# Patient Record
Sex: Female | Born: 1995 | Race: White | Hispanic: No | Marital: Single | Smoking: Never smoker
Health system: Southern US, Community
[De-identification: ages and names within clinical notes are randomized; demographics above are authoritative.]

## PROBLEM LIST (undated history)

## (undated) DIAGNOSIS — T7840XA Allergy, unspecified, initial encounter: Secondary | ICD-10-CM

## (undated) HISTORY — DX: Allergy, unspecified, initial encounter: T78.40XA

## (undated) HISTORY — PX: MOLE REMOVAL: SHX2046

---
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
## 2014-03-18 ENCOUNTER — Telehealth: Payer: Self-pay | Admitting: Family Medicine

## 2014-03-18 DIAGNOSIS — D509 Iron deficiency anemia, unspecified: Secondary | ICD-10-CM

## 2014-03-18 NOTE — Telephone Encounter (Signed)
Does she just want the ferritin (iron) level?  Or iron level (ferritin) AND the blood count we usually do with it?

## 2014-03-18 NOTE — Telephone Encounter (Signed)
Spoke with mom and she said what ever labs were done last time.

## 2014-03-18 NOTE — Telephone Encounter (Signed)
Ok, both ferritin and CBC with diff orders placed.  Thanks!

## 2014-03-22 ENCOUNTER — Other Ambulatory Visit: Payer: Self-pay | Admitting: Family Medicine

## 2014-03-22 LAB — CBC WITH DIFFERENTIAL/PLATELET
Basophils Absolute: 0 10*3/uL (ref 0.0–0.1)
Basophils Relative: 0 % (ref 0–1)
EOS ABS: 0 10*3/uL (ref 0.0–0.7)
EOS PCT: 1 % (ref 0–5)
HEMATOCRIT: 39 % (ref 36.0–46.0)
HEMOGLOBIN: 13.1 g/dL (ref 12.0–15.0)
LYMPHS ABS: 1.4 10*3/uL (ref 0.7–4.0)
Lymphocytes Relative: 29 % (ref 12–46)
MCH: 30.3 pg (ref 26.0–34.0)
MCHC: 33.6 g/dL (ref 30.0–36.0)
MCV: 90.1 fL (ref 78.0–100.0)
MONOS PCT: 6 % (ref 3–12)
MPV: 10.6 fL (ref 9.4–12.4)
Monocytes Absolute: 0.3 10*3/uL (ref 0.1–1.0)
Neutro Abs: 3.1 10*3/uL (ref 1.7–7.7)
Neutrophils Relative %: 64 % (ref 43–77)
Platelets: 218 10*3/uL (ref 150–400)
RBC: 4.33 MIL/uL (ref 3.87–5.11)
RDW: 13.3 % (ref 11.5–15.5)
WBC: 4.9 10*3/uL (ref 4.0–10.5)

## 2014-03-23 LAB — FERRITIN: Ferritin: 40 ng/mL (ref 10–291)

## 2014-06-09 IMAGING — CR DG CHEST 2V
2 series · 2 of 2 positions shown · non-contrast
Comparison: None

CLINICAL DATA: Dyspnea

CHEST - 2 VIEW

[view not recorded (1 of 2)]
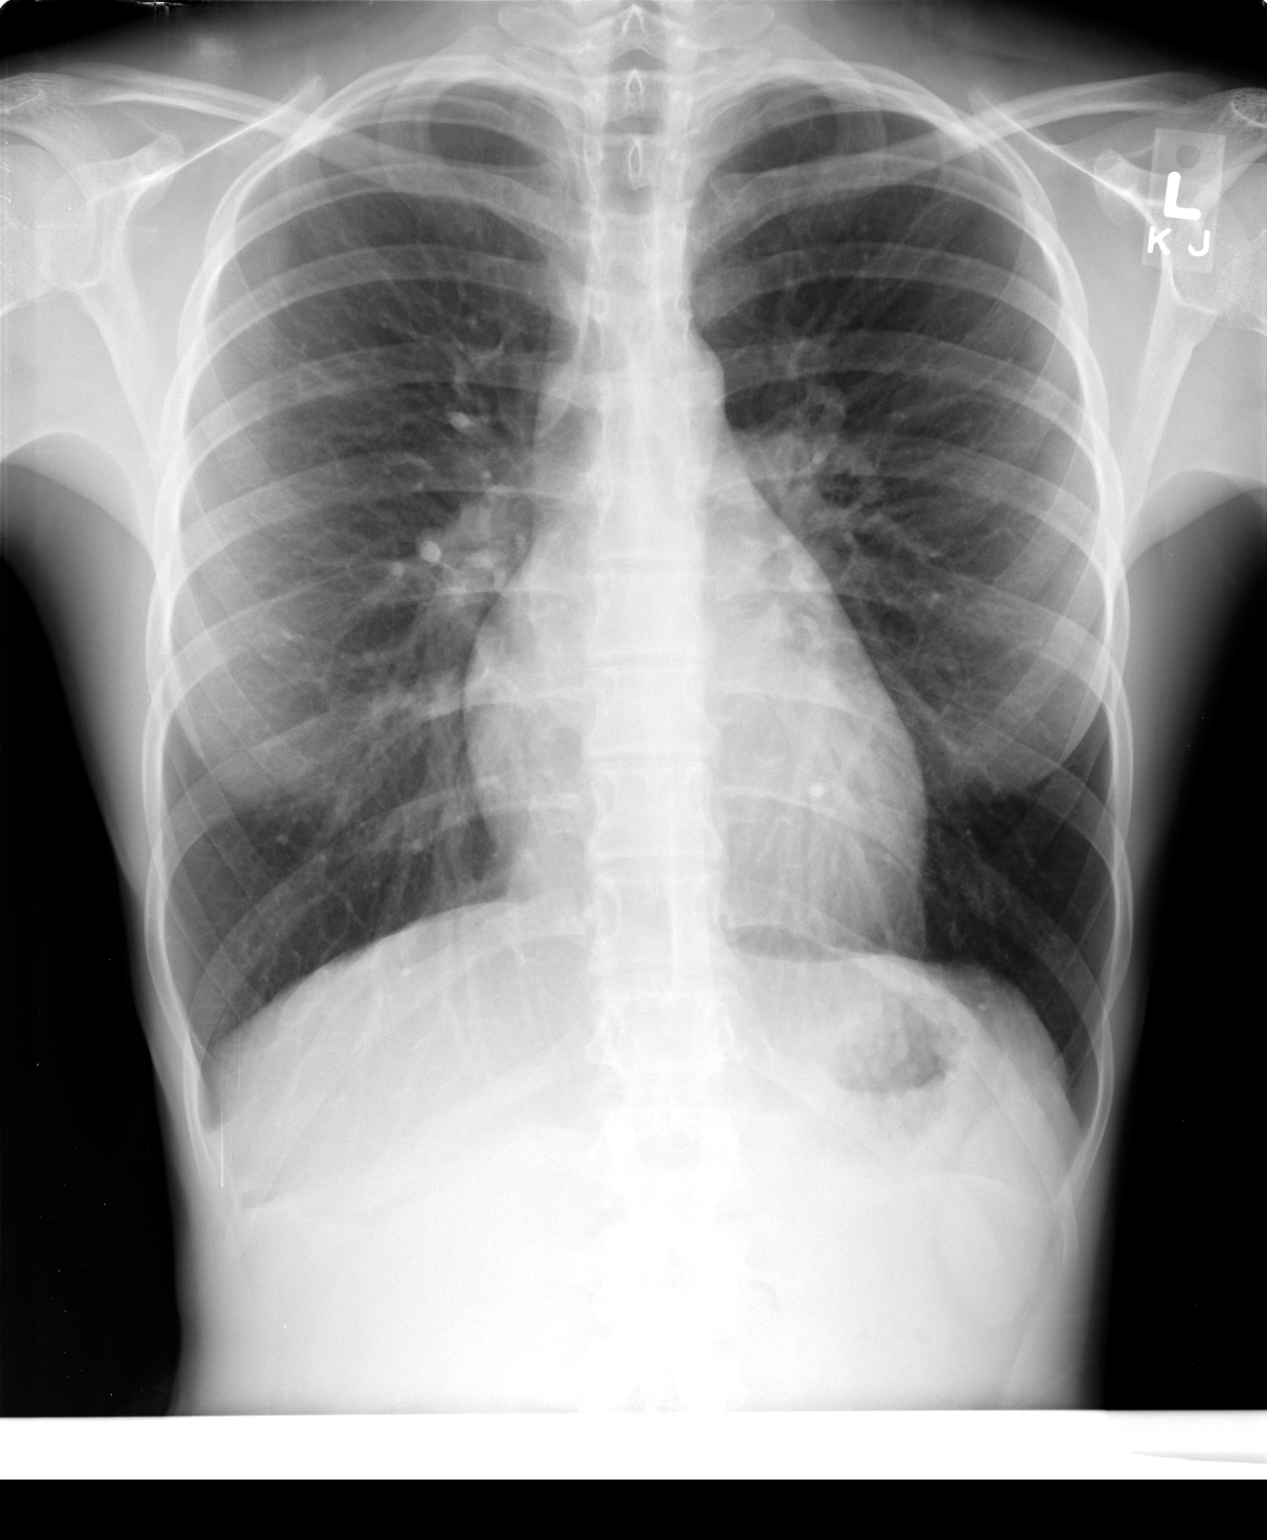

[view not recorded (2 of 2)]
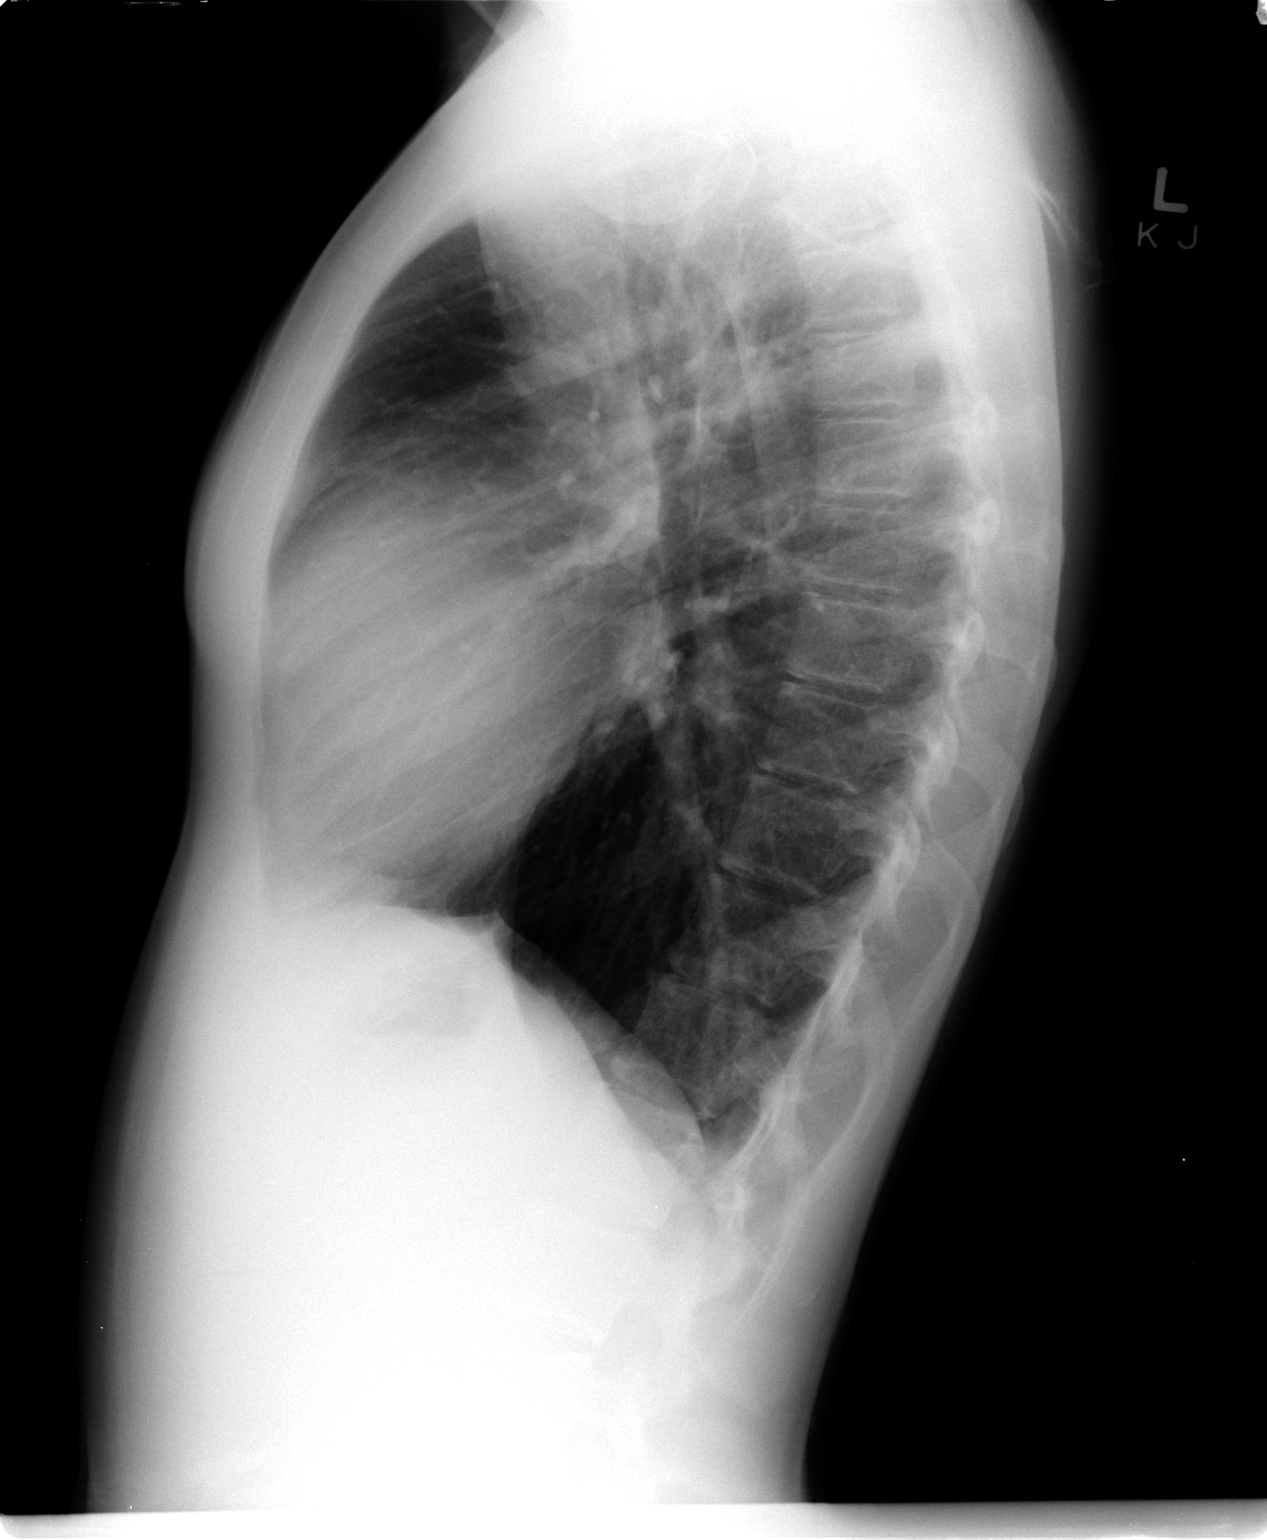

[2 of 2 positions shown; findings below may reference images not displayed]

FINDINGS: The heart size and mediastinal contours are within normal
limits.  The lungs are hyperinflated but clear.   The visualized
skeletal structures are unremarkable.
IMPRESSION: Negative exam.

## 2014-11-11 ENCOUNTER — Ambulatory Visit (INDEPENDENT_AMBULATORY_CARE_PROVIDER_SITE_OTHER): Payer: BLUE CROSS/BLUE SHIELD | Admitting: Family Medicine

## 2014-11-11 ENCOUNTER — Encounter: Payer: Self-pay | Admitting: Family Medicine

## 2014-11-11 VITALS — BP 99/62 | HR 57 | Ht 65.0 in | Wt 118.0 lb

## 2014-11-11 DIAGNOSIS — M25551 Pain in right hip: Secondary | ICD-10-CM | POA: Diagnosis not present

## 2014-11-11 NOTE — Patient Instructions (Signed)
You strained your hip flexor. Do home exercises (standing hip rotations, straight leg raises) 3 sets of 10 once a day - I'd recommend waiting until this weekend to start these given your pain level right now. Start stretches now - hold both hip flexor stretches for 20-30 seconds, repeat 3 times twice a day. Ice (or heat, whichever feels better) 15 minutes at a time 3-4 times a day. Cross train every other day with cycling. Cut running down to about 50% your usual mileage, try to avoid hills/stairs, speed work for now. Increase by 10% per week as long as pain is staying at 1-2/10 level. Follow up with me in 1 month or as needed.

## 2014-11-13 DIAGNOSIS — M25551 Pain in right hip: Secondary | ICD-10-CM | POA: Insufficient documentation

## 2014-11-13 NOTE — Assessment & Plan Note (Signed)
consistent with hip flexor strain.  Shown home exercises and stretches to do regularly after a short period of rest.  Icing, discussed relative rest and how to increase running/activity level.  F/u in 1 month.

## 2014-11-13 NOTE — Progress Notes (Signed)
PCP: Allison QuarryWISELTON,LOUISE A, MD  Subjective:   HPI: Patient is a 19 y.o. female here for right hip pain.  Patient reports she started to get pain on Tuesday the day after she ran a 5k Race had a long downhill she sprinted - thinks this may have led to her current pain in anterior hip/groin. No swelling or bruising. No radiation. Feels this more with stretching. Lifting leg is worse in the morning. Had been up to 35-40 miles a week. Yesterday able to run 11 miles - eased up as run went on. Worse with hills.  Past Medical History  Diagnosis Date  . Asthma   . Allergy     No current outpatient prescriptions on file prior to visit.   No current facility-administered medications on file prior to visit.    Past Surgical History  Procedure Laterality Date  . Mole removal      ON BACK    No Known Allergies  History   Social History  . Marital Status: Single    Spouse Name: N/A  . Number of Children: N/A  . Years of Education: N/A   Occupational History  . student    Social History Main Topics  . Smoking status: Never Smoker   . Smokeless tobacco: Not on file  . Alcohol Use: No  . Drug Use: No  . Sexual Activity: Not on file   Other Topics Concern  . Not on file   Social History Narrative    Family History  Problem Relation Age of Onset  . Diabetes Neg Hx   . Heart attack Neg Hx   . Hypertension Neg Hx   . Sudden death Neg Hx   . Allergies Maternal Grandmother     BP 99/62 mmHg  Pulse 57  Ht 5\' 5"  (1.651 m)  Wt 118 lb (53.524 kg)  BMI 19.64 kg/m2  Review of Systems: See HPI above.    Objective:  Physical Exam:  Gen: NAD  Right hip: No gross deformity, swelling, bruising. TTP anteriorly over iliopsoas.  No trochanter, other hip/back tenderness. FROM hip without pain. Strength 5/5 all motions but painful with straight leg raise, hip flexion Negative fabers, hop, fulcrum, piriformis.    Assessment & Plan:  1. Right hip pain - consistent with  hip flexor strain.  Shown home exercises and stretches to do regularly after a short period of rest.  Icing, discussed relative rest and how to increase running/activity level.  F/u in 1 month.
